# Patient Record
Sex: Male | Born: 1985 | Race: White | Hispanic: No | Marital: Married | Smoking: Never smoker
Health system: Southern US, Community
[De-identification: ages and names within clinical notes are randomized; demographics above are authoritative.]

## PROBLEM LIST (undated history)

## (undated) HISTORY — PX: SEPTOPLASTY: SUR1290

## (undated) HISTORY — PX: TURBINATE REDUCTION: SHX6157

---

## 2016-10-12 DIAGNOSIS — J4530 Mild persistent asthma, uncomplicated: Secondary | ICD-10-CM | POA: Diagnosis not present

## 2016-10-12 DIAGNOSIS — J301 Allergic rhinitis due to pollen: Secondary | ICD-10-CM | POA: Diagnosis not present

## 2016-10-22 DIAGNOSIS — H5213 Myopia, bilateral: Secondary | ICD-10-CM | POA: Diagnosis not present

## 2017-11-19 DIAGNOSIS — J310 Chronic rhinitis: Secondary | ICD-10-CM | POA: Diagnosis not present

## 2017-11-19 DIAGNOSIS — J343 Hypertrophy of nasal turbinates: Secondary | ICD-10-CM | POA: Diagnosis not present

## 2017-11-20 DIAGNOSIS — H5213 Myopia, bilateral: Secondary | ICD-10-CM | POA: Diagnosis not present

## 2017-12-31 DIAGNOSIS — J343 Hypertrophy of nasal turbinates: Secondary | ICD-10-CM | POA: Diagnosis not present

## 2017-12-31 DIAGNOSIS — J310 Chronic rhinitis: Secondary | ICD-10-CM | POA: Diagnosis not present

## 2018-08-29 DIAGNOSIS — J343 Hypertrophy of nasal turbinates: Secondary | ICD-10-CM | POA: Diagnosis not present

## 2018-08-29 DIAGNOSIS — J3489 Other specified disorders of nose and nasal sinuses: Secondary | ICD-10-CM | POA: Diagnosis not present

## 2019-03-18 DIAGNOSIS — J343 Hypertrophy of nasal turbinates: Secondary | ICD-10-CM | POA: Diagnosis not present

## 2019-03-18 DIAGNOSIS — J310 Chronic rhinitis: Secondary | ICD-10-CM | POA: Diagnosis not present

## 2019-04-17 DIAGNOSIS — Z23 Encounter for immunization: Secondary | ICD-10-CM | POA: Diagnosis not present

## 2019-08-11 DIAGNOSIS — H5213 Myopia, bilateral: Secondary | ICD-10-CM | POA: Diagnosis not present

## 2019-08-11 DIAGNOSIS — D23121 Other benign neoplasm of skin of left upper eyelid, including canthus: Secondary | ICD-10-CM | POA: Diagnosis not present

## 2019-08-17 DIAGNOSIS — H5213 Myopia, bilateral: Secondary | ICD-10-CM | POA: Diagnosis not present

## 2019-08-17 DIAGNOSIS — D23121 Other benign neoplasm of skin of left upper eyelid, including canthus: Secondary | ICD-10-CM | POA: Diagnosis not present

## 2019-09-16 DIAGNOSIS — J343 Hypertrophy of nasal turbinates: Secondary | ICD-10-CM | POA: Diagnosis not present

## 2019-09-16 DIAGNOSIS — J310 Chronic rhinitis: Secondary | ICD-10-CM | POA: Diagnosis not present

## 2019-10-18 ENCOUNTER — Encounter (HOSPITAL_BASED_OUTPATIENT_CLINIC_OR_DEPARTMENT_OTHER): Payer: Self-pay | Admitting: *Deleted

## 2019-10-18 ENCOUNTER — Other Ambulatory Visit: Payer: Self-pay

## 2019-10-18 ENCOUNTER — Emergency Department (HOSPITAL_BASED_OUTPATIENT_CLINIC_OR_DEPARTMENT_OTHER)
Admission: EM | Admit: 2019-10-18 | Discharge: 2019-10-19 | Disposition: A | Discharge status: Home / Self Care | Payer: BC Managed Care – PPO | Attending: Emergency Medicine | Admitting: Emergency Medicine

## 2019-10-18 ENCOUNTER — Emergency Department (HOSPITAL_BASED_OUTPATIENT_CLINIC_OR_DEPARTMENT_OTHER): Payer: BC Managed Care – PPO

## 2019-10-18 DIAGNOSIS — Z79899 Other long term (current) drug therapy: Secondary | ICD-10-CM | POA: Insufficient documentation

## 2019-10-18 DIAGNOSIS — R091 Pleurisy: Secondary | ICD-10-CM | POA: Insufficient documentation

## 2019-10-18 DIAGNOSIS — J189 Pneumonia, unspecified organism: Secondary | ICD-10-CM

## 2019-10-18 DIAGNOSIS — R079 Chest pain, unspecified: Secondary | ICD-10-CM | POA: Diagnosis not present

## 2019-10-18 DIAGNOSIS — R0789 Other chest pain: Secondary | ICD-10-CM | POA: Diagnosis not present

## 2019-10-18 LAB — CBC WITH DIFFERENTIAL/PLATELET
Abs Immature Granulocytes: 0.03 10*3/uL (ref 0.00–0.07)
Basophils Absolute: 0.1 10*3/uL (ref 0.0–0.1)
Basophils Relative: 1 %
Eosinophils Absolute: 0.6 10*3/uL — ABNORMAL HIGH (ref 0.0–0.5)
Eosinophils Relative: 5 %
HCT: 44.2 % (ref 39.0–52.0)
Hemoglobin: 14.8 g/dL (ref 13.0–17.0)
Immature Granulocytes: 0 %
Lymphocytes Relative: 23 %
Lymphs Abs: 2.7 10*3/uL (ref 0.7–4.0)
MCH: 28.6 pg (ref 26.0–34.0)
MCHC: 33.5 g/dL (ref 30.0–36.0)
MCV: 85.5 fL (ref 80.0–100.0)
Monocytes Absolute: 1.2 10*3/uL — ABNORMAL HIGH (ref 0.1–1.0)
Monocytes Relative: 10 %
Neutro Abs: 7.2 10*3/uL (ref 1.7–7.7)
Neutrophils Relative %: 61 %
Platelets: 216 10*3/uL (ref 150–400)
RBC: 5.17 MIL/uL (ref 4.22–5.81)
RDW: 13.2 % (ref 11.5–15.5)
WBC: 11.8 10*3/uL — ABNORMAL HIGH (ref 4.0–10.5)
nRBC: 0 % (ref 0.0–0.2)

## 2019-10-18 LAB — BASIC METABOLIC PANEL
Anion gap: 8 (ref 5–15)
BUN: 15 mg/dL (ref 6–20)
CO2: 26 mmol/L (ref 22–32)
Calcium: 9.2 mg/dL (ref 8.9–10.3)
Chloride: 102 mmol/L (ref 98–111)
Creatinine, Ser: 1.26 mg/dL — ABNORMAL HIGH (ref 0.61–1.24)
GFR calc Af Amer: >60 mL/min (ref >60)
GFR calc non Af Amer: >60 mL/min (ref >60)
Glucose, Bld: 92 mg/dL (ref 70–99)
Potassium: 3.9 mmol/L (ref 3.5–5.1)
Sodium: 136 mmol/L (ref 135–145)

## 2019-10-18 LAB — D-DIMER, QUANTITATIVE: D-Dimer, Quant: <0.27 ug/mL-FEU (ref 0.00–0.50)

## 2019-10-18 LAB — TROPONIN I (HIGH SENSITIVITY): Troponin I (High Sensitivity): 4 ng/L (ref <18)

## 2019-10-18 MED ORDER — HYDROCODONE-ACETAMINOPHEN 5-325 MG PO TABS
1.0000 | ORAL_TABLET | Freq: Once | ORAL | Status: AC
  Administered 2019-10-18: 1 via ORAL
  Filled 2019-10-18: qty 1

## 2019-10-18 NOTE — ED Triage Notes (Addendum)
Left lower chest pain below breast x 20 minutes PTA. increased pain with deep inspiration.  Denies N/V/SOB. Increased pain with deep inspiration

## 2019-10-19 MED ORDER — AZITHROMYCIN 250 MG PO TABS: 250.0000 mg | ORAL_TABLET | Freq: Every day | ORAL | 0 refills | Status: AC

## 2019-10-19 MED ORDER — IBUPROFEN 800 MG PO TABS: 800.0000 mg | ORAL_TABLET | Freq: Three times a day (TID) | ORAL | 0 refills | Status: AC

## 2019-10-19 MED ORDER — KETOROLAC TROMETHAMINE 15 MG/ML IJ SOLN
15.0000 mg | Freq: Once | INTRAMUSCULAR | Status: AC
  Administered 2019-10-19: 15 mg via INTRAVENOUS
  Filled 2019-10-19: qty 1

## 2019-10-19 NOTE — ED Provider Notes (Signed)
[facility] EMERGENCY DEPARTMENT Provider Note   CSN: [id number] Arrival date & time: 10/18/19  2108     History Chief Complaint  Patient presents with  . Chest Pain    Brian Morales is a 34 y.o. male.  HPI     This a 34 year old male who presents with chest pain.  Patient reports acute onset of chest pain around 9:30 PM.  He reports that it is in the left lower chest.  It is sharp in nature.  It is worsened with deep breathing.  He has never had pain like this before.  No recent history of surgeries, leg swelling, blood clots.  Does report that he traveled to [facility].  He denies any cough or shortness of breath.  Denies any fevers.  Currently he rates his pain 8 out of 10.  He did take some ibuprofen at home with minimal relief.  Denies history of hypertension, hyperlipidemia, diabetes, early family history of heart disease.  History reviewed. No pertinent past medical history.  There are no problems to display for this patient.   Past Surgical History:  Procedure Laterality Date  . SEPTOPLASTY    . TURBINATE REDUCTION         History reviewed. No pertinent family history.  Social History   Tobacco Use  . Smoking status: Never Smoker  . Smokeless tobacco: Never Used  Substance Use Topics  . Alcohol use: Yes  . Drug use: Never    Home Medications Prior to Admission medications   Medication Sig Start Date End Date Taking? Authorizing Provider  finasteride (PROPECIA) 1 MG tablet Take 1 mg by mouth daily.   Yes [provider]  omeprazole (PRILOSEC) 20 MG capsule Take 20 mg by mouth daily.   Yes [provider]  azithromycin (ZITHROMAX) 250 MG tablet Take 1 tablet (250 mg total) by mouth daily. Take first 2 tablets together, then 1 every day until finished. 10/19/19   Lisset Ketchem, Mayer Masker, MD  ibuprofen (ADVIL) 800 MG tablet Take 1 tablet (800 mg total) by mouth 3 (three) times daily. 10/19/19   Keiji Melland, Mayer Masker, MD    Allergies     Patient has no known allergies.  Review of Systems   Review of Systems  Constitutional: Negative for fever.  Respiratory: Negative for cough and shortness of breath.   Cardiovascular: Positive for chest pain. Negative for leg swelling.  Gastrointestinal: Negative for abdominal pain, nausea and vomiting.  Genitourinary: Negative for dysuria.  All other systems reviewed and are negative.   Physical Exam Updated Vital Signs BP 130/81   Pulse 79   Temp 98.1 F (36.7 C) (Oral)   Resp 20   Ht 1.854 m (6\' 1" )   Wt 93.9 kg   SpO2 98%   BMI 27.31 kg/m   Physical Exam Vitals and nursing note reviewed.  Constitutional:      Appearance: He is well-developed. He is not ill-appearing.  HENT:     Head: Normocephalic and atraumatic.  Eyes:     Pupils: Pupils are equal, round, and reactive to light.  Cardiovascular:     Rate and Rhythm: Normal rate and regular rhythm.     Heart sounds: Normal heart sounds. No murmur.  Pulmonary:     Effort: Pulmonary effort is normal. No respiratory distress.     Breath sounds: Normal breath sounds. No decreased breath sounds or wheezing.  Chest:     Chest wall: No tenderness or crepitus.  Abdominal:  General: Bowel sounds are normal.     Palpations: Abdomen is soft.     Tenderness: There is no abdominal tenderness. There is no rebound.  Musculoskeletal:     Cervical back: Neck supple.     Right lower leg: No tenderness. No edema.     Left lower leg: No tenderness. No edema.  Skin:    General: Skin is warm and dry.  Neurological:     Mental Status: He is alert and oriented to person, place, and time.  Psychiatric:        Mood and Affect: Mood normal.     ED Results / Procedures / Treatments   Labs (all labs ordered are listed, but only abnormal results are displayed) Labs Reviewed  BASIC METABOLIC PANEL - Abnormal; Notable for the following components:      Result Value   Creatinine, Ser 1.26 (*)    All other components within  normal limits  CBC WITH DIFFERENTIAL/PLATELET - Abnormal; Notable for the following components:   WBC 11.8 (*)    Monocytes Absolute 1.2 (*)    Eosinophils Absolute 0.6 (*)    All other components within normal limits  D-DIMER, QUANTITATIVE (NOT AT [facility])  TROPONIN I (HIGH SENSITIVITY)    EKG EKG Interpretation  Date/Time:  Sunday October 18 2019 21:13:48 EDT Ventricular Rate:  106 PR Interval:  134 QRS Duration: 82 QT Interval:  300 QTC Calculation: 398 R Axis:   93 Text Interpretation: Sinus tachycardia Rightward axis Borderline ECG Confirmed by Brian Morales ([id number]) on 10/18/2019 10:55:44 PM   Radiology DG Chest 2 View  Result Date: 10/18/2019 CLINICAL DATA:  Chest pain EXAM: CHEST - 2 VIEW COMPARISON:  None. FINDINGS: Right lung is clear. Streaky opacity at the left base. No pleural effusion. Normal heart size. No pneumothorax. IMPRESSION: Streaky opacity at the left base may reflect atelectasis or mild pneumonia Electronically Signed   By: Jasmine Pang M.D.   On: 10/18/2019 23:35    Procedures Procedures (including critical care time)  Medications Ordered in ED Medications  HYDROcodone-acetaminophen (NORCO/VICODIN) 5-325 MG per tablet 1 tablet (1 tablet Oral Given 10/18/19 2318)  ketorolac (TORADOL) 15 MG/ML injection 15 mg (15 mg Intravenous Given 10/19/19 0102)    ED Course  I have reviewed the triage vital signs and the nursing notes.  Pertinent labs & imaging results that were available during my care of the patient were reviewed by me and considered in my medical decision making (see chart for details).    MDM Rules/Calculators/A&P                       Patient presents with chest pain.  Is it pleuritic in nature and sharp.  He is overall nontoxic-appearing vital signs are reassuring.  He is afebrile.  O2 sat is 98% on room air.  EKG shows no evidence of acute ischemia or arrhythmia.  Doubt ACS given story but will check screening troponin.  Other  considerations include PE.  Will send a D-dimer as he is relatively low risk.  Chest x-ray obtained to rule out pneumothorax or pneumonia.  D-dimer is negative.  Troponin is negative.  Chest x-ray without pneumothorax but does show some atelectasis versus pneumonia in the left lower lobe which is corresponding to the patient's symptoms.  He denies any fevers or cough but does have a leukocytosis to 11.  Given that and his symptoms, would like to treat for community-acquired pneumonia.  I discussed the work-up  and results with the patient.  Recommend anti-inflammatory medications and will discharge with azithromycin for community-acquired pneumonia.  After history, exam, and medical workup I feel the patient has been appropriately medically screened and is safe for discharge home. Pertinent diagnoses were discussed with the patient. Patient was given return precautions.   Final Clinical Impression(s) / ED Diagnoses Final diagnoses:  Pleurisy  Community acquired pneumonia of left lower lobe of lung    Rx / DC Orders ED Discharge Orders         Ordered    azithromycin (ZITHROMAX) 250 MG tablet  Daily     10/19/19 0112    ibuprofen (ADVIL) 800 MG tablet  3 times daily     10/19/19 0112           Jakaila Norment, Barbette Hair, MD 10/19/19 0131

## 2019-10-19 NOTE — Discharge Instructions (Addendum)
You were seen today for chest pain.  Your exam and physical exam is consistent with pleurisy which is likely related to a pneumonia.  Take antibiotics as prescribed.  Take ibuprofen 3 times daily for pain.  Make sure that you are taking as deep breaths as possible.  Follow-up with your primary physician of this department if you have any new or worsening symptoms.

## 2021-12-13 IMAGING — CR DG CHEST 2V
2 series · 2 of 2 positions shown · non-contrast
Comparison: None.

CLINICAL DATA: Chest pain

EXAM:
CHEST - 2 VIEW

[w chest lat]
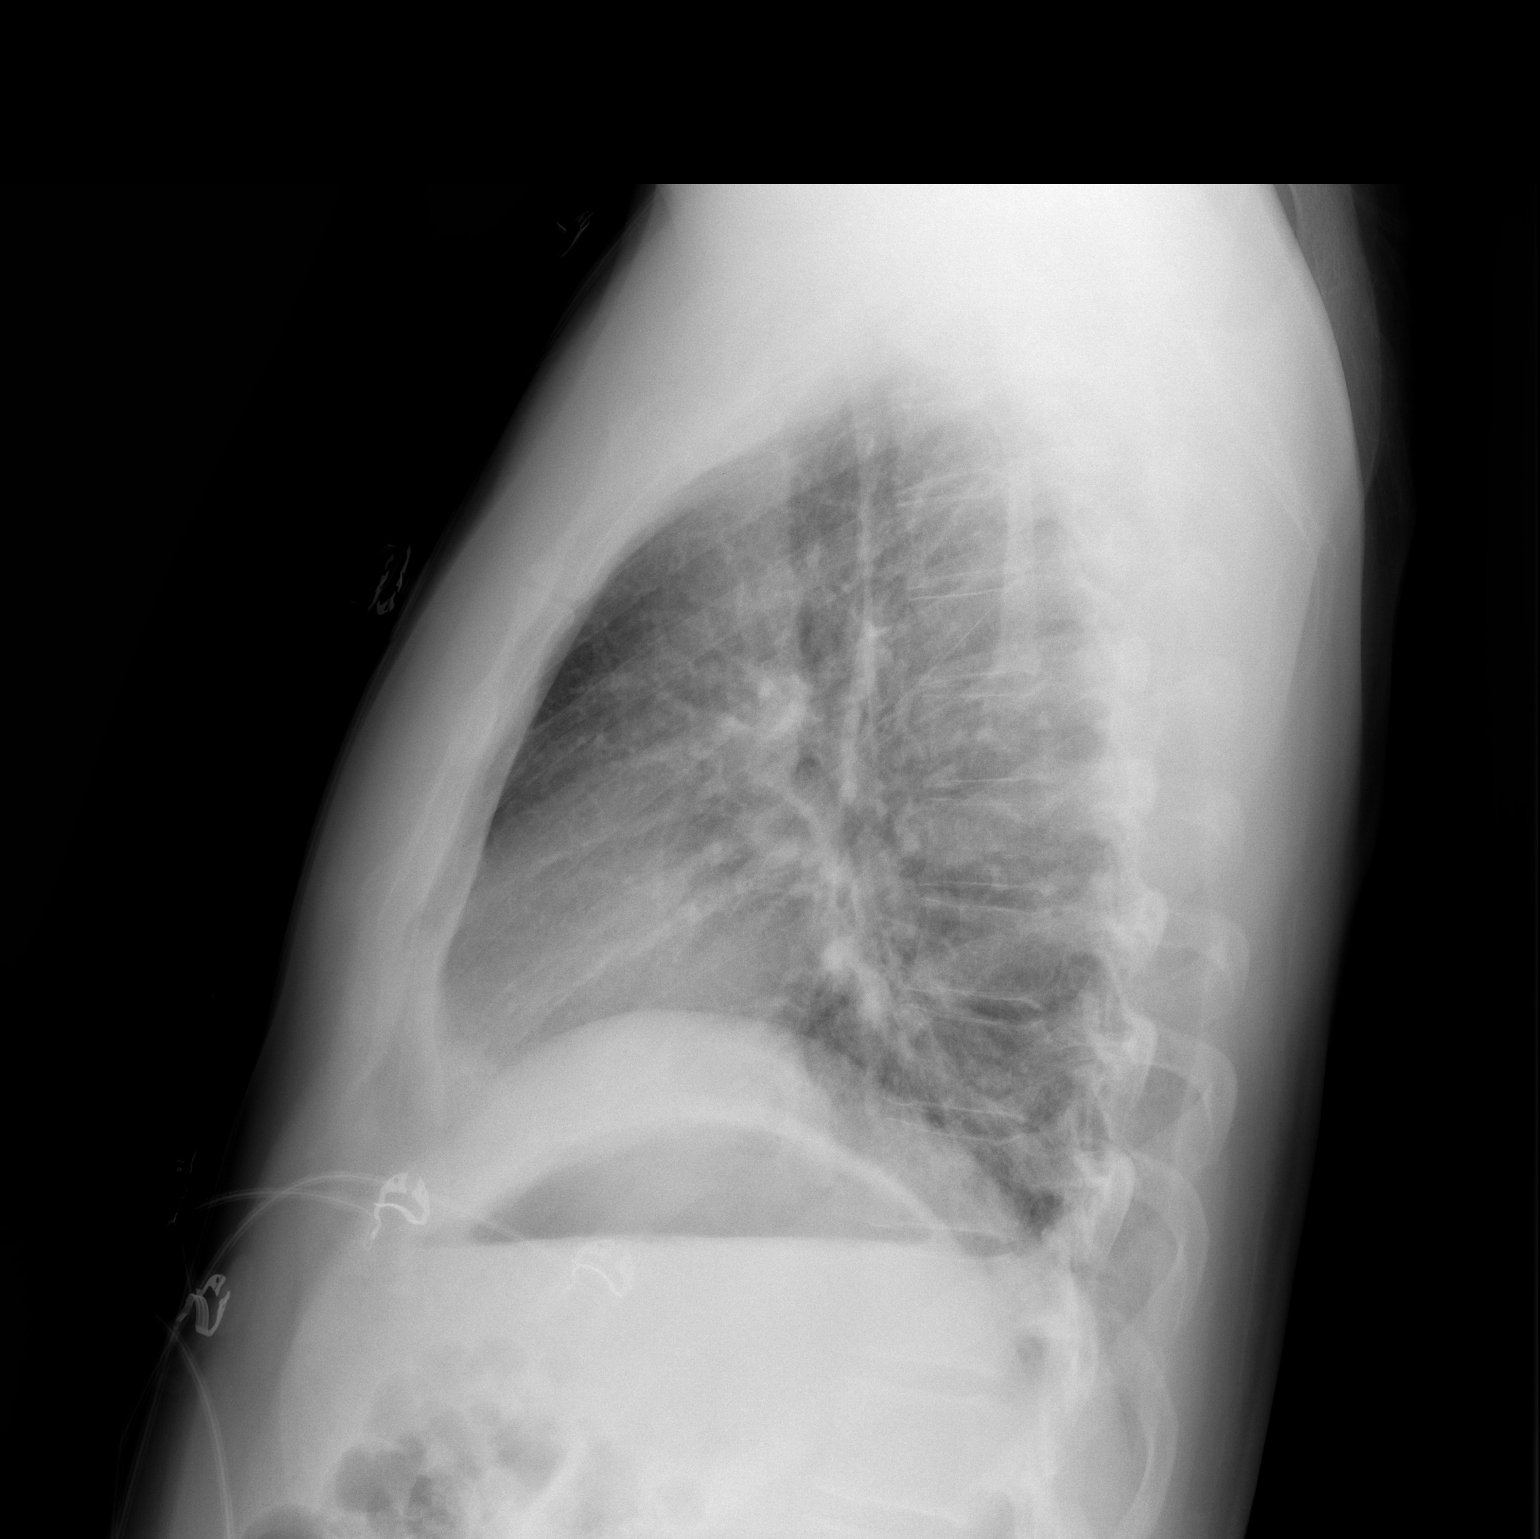

[w chest pa]
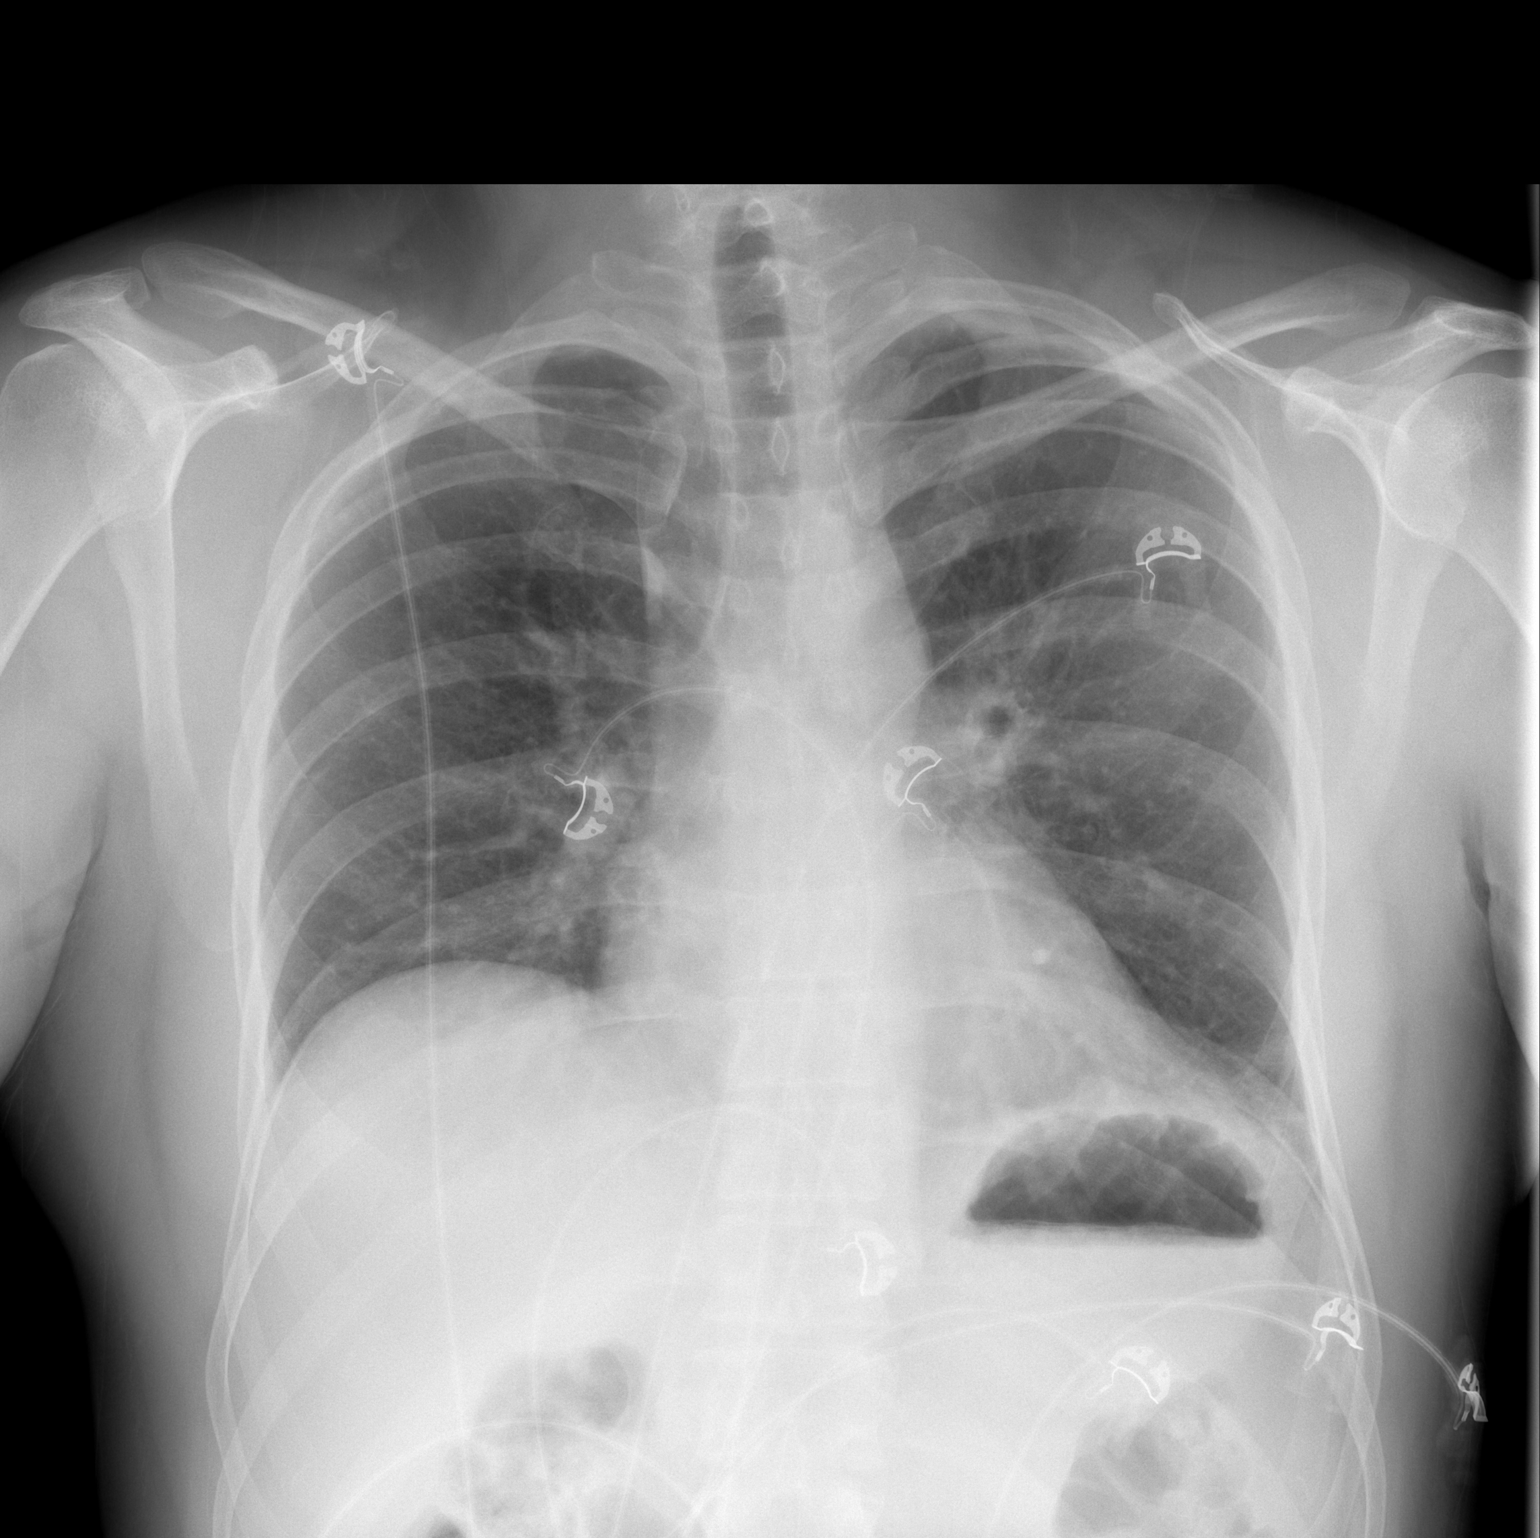

[2 of 2 positions shown; findings below may reference images not displayed]

FINDINGS: Right lung is clear. Streaky opacity at the left base. No pleural
effusion. Normal heart size. No pneumothorax.
IMPRESSION: Streaky opacity at the left base may reflect atelectasis or mild
pneumonia
